# Patient Record
Sex: Female | Born: 1990 | Race: White | Hispanic: No | Marital: Single | Smoking: Current every day smoker
Health system: Southern US, Community
[De-identification: ages and names within clinical notes are randomized; demographics above are authoritative.]

## PROBLEM LIST (undated history)

## (undated) DIAGNOSIS — R112 Nausea with vomiting, unspecified: Secondary | ICD-10-CM

## (undated) DIAGNOSIS — M545 Low back pain, unspecified: Secondary | ICD-10-CM

## (undated) DIAGNOSIS — M5136 Other intervertebral disc degeneration, lumbar region: Secondary | ICD-10-CM

## (undated) DIAGNOSIS — J45909 Unspecified asthma, uncomplicated: Secondary | ICD-10-CM

## (undated) DIAGNOSIS — M519 Unspecified thoracic, thoracolumbar and lumbosacral intervertebral disc disorder: Secondary | ICD-10-CM

## (undated) DIAGNOSIS — M4310 Spondylolisthesis, site unspecified: Secondary | ICD-10-CM

## (undated) DIAGNOSIS — R519 Headache, unspecified: Secondary | ICD-10-CM

## (undated) DIAGNOSIS — M4300 Spondylolysis, site unspecified: Secondary | ICD-10-CM

## (undated) HISTORY — PX: DILATION AND CURETTAGE OF UTERUS: SHX78

## (undated) HISTORY — DX: Unspecified thoracic, thoracolumbar and lumbosacral intervertebral disc disorder: M51.9

## (undated) HISTORY — DX: Other intervertebral disc degeneration, lumbar region: M51.36

## (undated) HISTORY — DX: Spondylolisthesis, site unspecified: M43.10

## (undated) HISTORY — DX: Low back pain, unspecified: M54.50

## (undated) HISTORY — DX: Spondylolysis, site unspecified: M43.00

## (undated) HISTORY — PX: TONSILLECTOMY: SUR1361

---

## 2021-01-18 ENCOUNTER — Other Ambulatory Visit: Payer: Self-pay

## 2021-01-18 ENCOUNTER — Other Ambulatory Visit: Payer: Self-pay | Admitting: Orthopedic Surgery

## 2021-01-18 DIAGNOSIS — R200 Anesthesia of skin: Secondary | ICD-10-CM | POA: Insufficient documentation

## 2021-01-18 DIAGNOSIS — R29898 Other symptoms and signs involving the musculoskeletal system: Secondary | ICD-10-CM

## 2021-01-18 DIAGNOSIS — M79604 Pain in right leg: Secondary | ICD-10-CM | POA: Insufficient documentation

## 2021-01-18 DIAGNOSIS — M545 Low back pain, unspecified: Secondary | ICD-10-CM

## 2021-01-18 DIAGNOSIS — M79605 Pain in left leg: Secondary | ICD-10-CM

## 2021-01-23 ENCOUNTER — Other Ambulatory Visit: Payer: Self-pay

## 2021-01-23 ENCOUNTER — Encounter: Payer: Self-pay | Admitting: Vascular Surgery

## 2021-01-23 ENCOUNTER — Ambulatory Visit (INDEPENDENT_AMBULATORY_CARE_PROVIDER_SITE_OTHER): Payer: No Typology Code available for payment source | Admitting: Vascular Surgery

## 2021-01-23 VITALS — BP 100/69 | HR 64 | Temp 98.2°F | Resp 14 | Ht 62.0 in | Wt 118.0 lb

## 2021-01-23 DIAGNOSIS — M545 Low back pain, unspecified: Secondary | ICD-10-CM | POA: Diagnosis not present

## 2021-01-23 NOTE — Progress Notes (Signed)
Patient name: April Montoya MRN: [id number] DOB: 1990/11/05 Sex: female  REASON FOR CONSULT: Evaluate for L5-S1 ALIF  HPI: April Montoya is a 30 y.o. female, with degenerative disc disease and asthma that presents for evaluation of L5-S1 ALIF.  Patient states she had an injury at work last year when she was working [employer].  She has had chronic back pain since that injury.  Ultimately she has been under the care of Dr. Yevette Edwards and has failed conservative management.  He has recommended an anterior L5-S1 fusion and vascular surgery was asked to evaluate her for abdominal exposure.  She states she has had no previous abdominal surgery.  She has had a D&C that was transvaginal.  She does smoke cigarettes.  Past Medical History:  Diagnosis Date   Annular tear    DDD (degenerative disc disease), lumbar    L5-S1   Low back pain    Increased with sitting   Pars defect    Bilateral   Spondylolisthesis     Past surgical history: Denies  No family history on file.  SOCIAL HISTORY: Social History   Socioeconomic History   Marital status: Single    Spouse name: Not on file   Number of children: Not on file   Years of education: Not on file   Highest education level: Not on file  Occupational History   Not on file  Tobacco Use   Smoking status: Not on file   Smokeless tobacco: Not on file  Substance and Sexual Activity   Alcohol use: Not on file   Drug use: Not on file   Sexual activity: Not on file  Other Topics Concern   Not on file  Social History Narrative   Not on file   Social Determinants of Health   Financial Resource Strain: Not on file  Food Insecurity: Not on file  Transportation Needs: Not on file  Physical Activity: Not on file  Stress: Not on file  Social Connections: Not on file  Intimate Partner Violence: Not on file    Not on File  Current Outpatient Medications  Medication Sig Dispense Refill   diclofenac (VOLTAREN) 75 MG EC  tablet Take 75 mg by mouth 2 (two) times daily.     ondansetron (ZOFRAN-ODT) 4 MG disintegrating tablet Take by mouth.     No current facility-administered medications for this visit.    REVIEW OF SYSTEMS:  [X]  denotes positive finding, [ ]  denotes negative finding Cardiac  Comments:  Chest pain or chest pressure:    Shortness of breath upon exertion:    Short of breath when lying flat:    Irregular heart rhythm:        Vascular    Pain in calf, thigh, or hip brought on by ambulation:    Pain in feet at night that wakes you up from your sleep:     Blood clot in your veins:    Leg swelling:         Pulmonary    Oxygen at home:    Productive cough:     Wheezing:         Neurologic    Sudden weakness in arms or legs:     Sudden numbness in arms or legs:     Sudden onset of difficulty speaking or slurred speech:    Temporary loss of vision in one eye:     Problems with dizziness:         Gastrointestinal  Blood in stool:     Vomited blood:         Genitourinary    Burning when urinating:     Blood in urine:        Psychiatric    Major depression:         Hematologic    Bleeding problems:    Problems with blood clotting too easily:        Skin    Rashes or ulcers:        Constitutional    Fever or chills:      PHYSICAL EXAM: There were no vitals filed for this visit.  GENERAL: The patient is a well-nourished female, in no acute distress. The vital signs are documented above. CARDIAC: There is a regular rate and rhythm.  VASCULAR:  2+ palpable femoral pulses bilaterally 2+ palpable PT pulses bilaterally PULMONARY: No respiratory distress. ABDOMEN: Soft and non-tender.  No previous incisions. MUSCULOSKELETAL: There are no major deformities or cyanosis. NEUROLOGIC: No focal weakness or paresthesias are detected. SKIN: There are no ulcers or rashes noted. PSYCHIATRIC: The patient has a normal affect.  DATA:   MRI reviewed 12/20/2020 and L5-S1 disc space  as well as vessels visualized  Assessment/Plan:  30 year old female presents with chronic lower back pain for preop evaluation of planned L5-S1 ALIF with Dr. Yevette Edwards.  Vascular surgery has been asked to assist with anterior abdominal exposure.  She has no previous abdominal surgical history and I reviewed her MRI.  I think she should be a good candidate for anterior approach.  I discussed transverse incision over the L5-S1 disc space and mobilizing the rectus and then entering the retroperitoneum to mobilize the peritoneum and ureter across midline.  We discussed mobilizing iliac artery and vein.  Discussed risk of injury to the above structures.  All questions answered.   Cephus Shelling, MD [facility] Office: [phone]

## 2021-02-02 ENCOUNTER — Other Ambulatory Visit: Payer: Self-pay

## 2021-02-02 NOTE — Pre-Procedure Instructions (Signed)
Surgical Instructions    Your procedure is scheduled on Thursday 02/15/21.   Report to [facility] Main Entrance "A" at 05:30 A.M., then check in with the Admitting office.  Call this number if you have problems the morning of surgery:  [phone]   If you have any questions prior to your surgery date call [phone]: Open Monday-Friday 8am-4pm    Remember:  Do not eat after midnight the night before your surgery  You may drink clear liquids until 04:30 A.M. the morning of your surgery.   Clear liquids allowed are: Water, Non-Citrus Juices (without pulp), Carbonated Beverages, Clear Tea, Black Coffee ONLY (NO MILK, CREAM OR POWDERED CREAMER of any kind), and Gatorade  Patient Instructions  The night before surgery:  No food after midnight. ONLY clear liquids after midnight  The day of surgery (if you do NOT have diabetes):  Drink ONE (1) Pre-Surgery Clear Ensure by 04:30 A.M. the morning of surgery. Drink in one sitting. Do not sip.  This drink was given to you during your hospital  pre-op appointment visit.  Nothing else to drink after completing the  Pre-Surgery Clear Ensure.         If you have questions, please contact your surgeon's office.     Take these medicines the morning of surgery with A SIP OF WATER   albuterol (VENTOLIN HFA)- If needed      As of today, STOP taking any Aspirin (unless otherwise instructed by your surgeon) Aleve, Naproxen, Ibuprofen, Motrin, Advil, Goody's, BC's, all herbal medications, fish oil, Voltaren and all vitamins.          Do not wear jewelry or makeup Do not wear lotions, powders, perfumes/colognes, or deodorant. Do not shave 48 hours prior to surgery.  Men may shave face and neck. Do not bring valuables to the hospital. DO Not wear nail polish, gel polish, artificial nails, or any other type of covering on natural nails including finger and toenails. If patients have artificial nails, gel coating, etc. that need to be removed  by a nail salon please have this removed prior to surgery or surgery may need to be canceled/delayed if the surgeon/ anesthesia feels like the patient is unable to be adequately monitored.             April Montoya is not responsible for any belongings or valuables.  Do NOT Smoke (Tobacco/Vaping)  24 hours prior to your procedure If you use a CPAP at night, you may bring all equipment for your overnight stay.   Contacts, glasses, dentures or bridgework may not be worn into surgery, please bring cases for these belongings   For patients admitted to the hospital, discharge time will be determined by your treatment team.   Patients discharged the day of surgery will not be allowed to drive home, and someone needs to stay with them for 24 hours.  ONLY 1 SUPPORT PERSON MAY BE PRESENT WHILE YOU ARE IN SURGERY. IF YOU ARE TO BE ADMITTED ONCE YOU ARE IN YOUR ROOM YOU WILL BE ALLOWED TWO (2) VISITORS.  Minor children may have two parents present. Special consideration for safety and communication needs will be reviewed on a case by case basis.  Special instructions:    Oral Hygiene is also important to reduce your risk of infection.  Remember - BRUSH YOUR TEETH THE MORNING OF SURGERY WITH YOUR REGULAR TOOTHPASTE   [place]- Preparing For Surgery  Before surgery, you can play an important role. Because skin is  not sterile, your skin needs to be as free of germs as possible. You can reduce the number of germs on your skin by washing with CHG (chlorahexidine gluconate) Soap before surgery.  CHG is an antiseptic cleaner which kills germs and bonds with the skin to continue killing germs even after washing.     Please do not use if you have an allergy to CHG or antibacterial soaps. If your skin becomes reddened/irritated stop using the CHG.  Do not shave (including legs and underarms) for at least 48 hours prior to first CHG shower. It is OK to shave your face.  Please follow these instructions  carefully.     Shower the NIGHT BEFORE SURGERY and the MORNING OF SURGERY with CHG Soap.   If you chose to wash your hair, wash your hair first as usual with your normal shampoo. After you shampoo, rinse your hair and body thoroughly to remove the shampoo.  Then [employer] and genitals (private parts) with your normal soap and rinse thoroughly to remove soap.  After that Use CHG Soap as you would any other liquid soap. You can apply CHG directly to the skin and wash gently with a scrungie or a clean washcloth.   Apply the CHG Soap to your body ONLY FROM THE NECK DOWN.  Do not use on open wounds or open sores. Avoid contact with your eyes, ears, mouth and genitals (private parts). Wash Face and genitals (private parts)  with your normal soap.   Wash thoroughly, paying special attention to the area where your surgery will be performed.  Thoroughly rinse your body with warm water from the neck down.  DO NOT shower/wash with your normal soap after using and rinsing off the CHG Soap.  Pat yourself dry with a CLEAN TOWEL.  Wear CLEAN PAJAMAS to bed the night before surgery  Place CLEAN SHEETS on your bed the night before your surgery  DO NOT SLEEP WITH PETS.   Day of Surgery:  Take a shower with CHG soap. Wear Clean/Comfortable clothing the morning of surgery Do not apply any deodorants/lotions.   Remember to brush your teeth WITH YOUR REGULAR TOOTHPASTE.   Please read over the following fact sheets that you were given.

## 2021-02-06 ENCOUNTER — Inpatient Hospital Stay (HOSPITAL_COMMUNITY)
Admission: RE | Admit: 2021-02-06 | Discharge: 2021-02-06 | Disposition: A | Discharge status: Home / Self Care | Payer: BC Managed Care – PPO | Source: Ambulatory Visit

## 2021-02-08 ENCOUNTER — Encounter (HOSPITAL_COMMUNITY): Payer: Self-pay

## 2021-02-08 ENCOUNTER — Encounter (HOSPITAL_COMMUNITY)
Admission: RE | Admit: 2021-02-08 | Discharge: 2021-02-08 | Disposition: A | Discharge status: Home / Self Care | Payer: BC Managed Care – PPO | Source: Ambulatory Visit | Attending: Orthopedic Surgery | Admitting: Orthopedic Surgery

## 2021-02-08 ENCOUNTER — Other Ambulatory Visit: Payer: Self-pay

## 2021-02-08 DIAGNOSIS — Z01812 Encounter for preprocedural laboratory examination: Secondary | ICD-10-CM | POA: Insufficient documentation

## 2021-02-08 HISTORY — DX: Headache, unspecified: R51.9

## 2021-02-08 HISTORY — DX: Unspecified asthma, uncomplicated: J45.909

## 2021-02-08 LAB — URINALYSIS, ROUTINE W REFLEX MICROSCOPIC
Bacteria, UA: NONE SEEN
Bilirubin Urine: NEGATIVE
Glucose, UA: NEGATIVE mg/dL
Ketones, ur: NEGATIVE mg/dL
Leukocytes,Ua: NEGATIVE
Nitrite: NEGATIVE
Protein, ur: NEGATIVE mg/dL
Specific Gravity, Urine: 1.012 (ref 1.005–1.030)
pH: 6 (ref 5.0–8.0)

## 2021-02-08 LAB — CBC WITH DIFFERENTIAL/PLATELET
Abs Immature Granulocytes: 0.01 10*3/uL (ref 0.00–0.07)
Basophils Absolute: 0.1 10*3/uL (ref 0.0–0.1)
Basophils Relative: 1 %
Eosinophils Absolute: 0.1 10*3/uL (ref 0.0–0.5)
Eosinophils Relative: 1 %
HCT: 43 % (ref 36.0–46.0)
Hemoglobin: 13.9 g/dL (ref 12.0–15.0)
Immature Granulocytes: 0 %
Lymphocytes Relative: 30 %
Lymphs Abs: 1.9 10*3/uL (ref 0.7–4.0)
MCH: 29.2 pg (ref 26.0–34.0)
MCHC: 32.3 g/dL (ref 30.0–36.0)
MCV: 90.3 fL (ref 80.0–100.0)
Monocytes Absolute: 0.4 10*3/uL (ref 0.1–1.0)
Monocytes Relative: 6 %
Neutro Abs: 4.1 10*3/uL (ref 1.7–7.7)
Neutrophils Relative %: 62 %
Platelets: 221 10*3/uL (ref 150–400)
RBC: 4.76 MIL/uL (ref 3.87–5.11)
RDW: 11.9 % (ref 11.5–15.5)
WBC: 6.6 10*3/uL (ref 4.0–10.5)
nRBC: 0 % (ref 0.0–0.2)

## 2021-02-08 LAB — PROTIME-INR
INR: 1.1 (ref 0.8–1.2)
Prothrombin Time: 14 seconds (ref 11.4–15.2)

## 2021-02-08 LAB — COMPREHENSIVE METABOLIC PANEL
ALT: 16 U/L (ref 0–44)
AST: 20 U/L (ref 15–41)
Albumin: 4.4 g/dL (ref 3.5–5.0)
Alkaline Phosphatase: 54 U/L (ref 38–126)
Anion gap: 7 (ref 5–15)
BUN: 16 mg/dL (ref 6–20)
CO2: 27 mmol/L (ref 22–32)
Calcium: 9.5 mg/dL (ref 8.9–10.3)
Chloride: 104 mmol/L (ref 98–111)
Creatinine, Ser: 0.63 mg/dL (ref 0.44–1.00)
GFR, Estimated: >60 mL/min (ref >60)
Glucose, Bld: 76 mg/dL (ref 70–99)
Potassium: 4 mmol/L (ref 3.5–5.1)
Sodium: 138 mmol/L (ref 135–145)
Total Bilirubin: 0.7 mg/dL (ref 0.3–1.2)
Total Protein: 7.1 g/dL (ref 6.5–8.1)

## 2021-02-08 LAB — SURGICAL PCR SCREEN
MRSA, PCR: NEGATIVE
Staphylococcus aureus: NEGATIVE

## 2021-02-08 LAB — APTT: aPTT: 26 seconds (ref 24–36)

## 2021-02-08 NOTE — Progress Notes (Addendum)
PCP - no pcp Cardiologist - denies  PPM/ICD - denies   Chest x-ray - n/a EKG - n/a Stress Test -denies  ECHO - denies Cardiac Cath - denies  Sleep Study - denies   No diabetes  As of today, STOP taking any Aspirin (unless otherwise instructed by your surgeon) Aleve, Naproxen, Ibuprofen, Motrin, Advil, Goody's, BC's, all herbal medications, fish oil, Voltaren and all vitamins.  ERAS Protcol -no   COVID TEST- 02/13/21 at 1pm   Anesthesia review: yes, positive pregnancy test morning of PAT appt. [place] notified and instructed patient to go get an ultrasound today. Will await further instruction. Revonda Standard, PA-C informed of information.   Patient denies shortness of breath, fever, cough and chest pain at PAT appointment   All instructions explained to the patient, with a verbal understanding of the material. Patient agrees to go over the instructions while at home for a better understanding. Patient also instructed to self quarantine after being tested for COVID-19. The opportunity to ask questions was provided.

## 2021-02-09 NOTE — Progress Notes (Signed)
Called patient to follow up about ultrasound appointment. Voicemail left and phone number updated in chart. Patient stated she was going for an ultrasound at the ER after her PAT appt yesterday.

## 2021-02-09 NOTE — Progress Notes (Addendum)
Anesthesia Chart Review:  Case: [id number] Date/Time: 02/15/21 0715   Procedures:      ANTERIOR LUMBAR INTERBODY FUSION LUMBAR 5 - SACRUM 1 WITH INSTRUMENTATION AND ALLOGRAFT     POSTERIOR SPINAL FUSION LUMBAR 5 - SACRUM 1 WITH INSTRUMENTATION AND ALLOGRAFT     ABDOMINAL EXPOSURE   Anesthesia type: General   Pre-op diagnosis: DEGENERATIVE DISC DISEASE   [facility] ROOM 05 / [facility] OR   Surgeons: Estill Bamberg, MD; Cephus Shelling, MD       DISCUSSION: Patient is a 30 year old female scheduled for the above procedure. She had a work-related injury.   History includes smoking, asthma, degenerative disc disease.  Patient reported + home pregnancy test on 02/08/21. Dr. Yevette Edwards was notified. Reportedly, she does not have a PCP or current GYN but attempted to contact previous GYN and could not be worked in to clinic, so GYN office advised her to go to the the ED for an [place]. She reportedly was seen at the [facility] ED in [place] on 02/09/21 and was told she was not pregnant but was referred to a "specialist" for right ovarian cyst. She wants to proceed with surgery.   Plans to proceed and any additional lab orders will need to be determined after further review of 9/922 ED records. They are not currently viewable in Care Everywhere. Chart will be left for follow-up.  Currently she is scheduled for preoperative COVID-19 testing on 02/13/2021.  ADDENDUM 02/12/21 9:45 AM:  02/09/21 ED visit is now viewable in Care Everywhere. She presented with vaginal bleeding and reports of + home pregnancy test a few days prior. History of previous miscarriage of twins. Per documentation, "Patient underwent laboratory studies showing no significant abnormalities and urine hCG was negative. 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 Urine B-HCG was negative. Urine culture + > 100,000 Lactococcus species. WBC 5.7, H/H 13.3/40.3, PLT 200, Na 140, K 4.3, glucose 86, BUN 14, Cr 0.59. Urine culture results called to Lupita Leash at Dr. Marshell Levan office.   ADDENDUM 02/13/21 11:07 AM: Lupita Leash says PA to call in Cipro if ED did not treat Lactococcus UTI. Also reviewed 02/09/21 ED note with anesthesiologist Marcene Duos, MD. Plan for repeat H/H (since vaginal bleeding at ED visit) and serum qualitative B-hCG on arrival. Per PAT documentation, she also needs her T&S done on the day of surgery given reported + home pregnancy test on 02/08/21.    VS: BP 101/65   Pulse (!) 56   Temp 36.9 C (Oral)   Resp 17   Ht 5\' 2"  (1.575 m)   Wt 53 kg   SpO2 96%   BMI 21.38 kg/m    PROVIDERS: Pcp, No   LABS: Preoperative PAT labs noted.  (all labs ordered are listed, but only abnormal results are displayed)  Labs Reviewed  URINALYSIS, ROUTINE W REFLEX MICROSCOPIC - Abnormal; Notable for the following components:      Result Value   Hgb urine dipstick SMALL (*)    All other components within normal limits  SURGICAL PCR SCREEN  CBC WITH DIFFERENTIAL/PLATELET  COMPREHENSIVE METABOLIC PANEL  PROTIME-INR  APTT    IMAGES: pelvis, transvaginal 02/09/21 ([facility]): INTERPRETATION:  - The uterus is normal in size and echotexture , measuring 8.3  X 3.7 X 4.5 cm.  The endometrial stripe is normal in thickness , measuring 2 mm.  - Right ovary is normal in size and appearance, measuring 3.1 X 1.9 X 2.0 cm. Multiple small follicular type cysts are noted.  Color flow and spectral Doppler: Normal arterial inflow and venous outflow. No focal tenderness.  - Left ovary is normal in size and appearance, measuring 2.7 X 1.7 X 3.1 cm. Multiple small follicular type cysts are noted. Also evident is a larger (10 x 15 x 18 mm) cyst with several  smaller cysts protruding into the lumen, appearance consistent with incidental cumulus oophorus.  Color flow and spectral Doppler: Normal arterial inflow and venous outflow.  - Note is made of prominent vessels in the left adnexal region.  - There is trace free fluid. 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 Otherwise unremarkable.    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 The L5-S1 disc demonstrates degeneration as described above. There is no central canal stenosis. There is mild bilateral foraminal stenosis with minimal extraforaminal abutment of both traversing L5 nerve roots.    EKG: N/A   CV: N/A  Past Medical History:  Diagnosis Date   Annular tear    Asthma    DDD (degenerative disc disease), lumbar    L5-S1   Headache    Low back pain    Increased with sitting   Pars defect    Bilateral   Spondylolisthesis     Past Surgical History:  Procedure Laterality Date   DILATION AND CURETTAGE OF UTERUS     TONSILLECTOMY      MEDICATIONS:  albuterol (VENTOLIN HFA) 108 (90 Base) MCG/ACT inhaler   Cyanocobalamin (B-12 PO)   diclofenac (VOLTAREN) 75 MG EC tablet   etonogestrel (NEXPLANON) 68 MG IMPL implant   Multiple Vitamin (MULTIVITAMIN WITH MINERALS) TABS tablet   No current facility-administered medications for this encounter.    Shonna Chock, PA-C Surgical Short Stay/Anesthesiology [facility] Phone [phone] [facility] Phone [phone] 02/09/2021 2:37 PM

## 2021-02-09 NOTE — Progress Notes (Signed)
Error/ Duplicate

## 2021-02-13 ENCOUNTER — Other Ambulatory Visit (HOSPITAL_COMMUNITY)
Admission: RE | Admit: 2021-02-13 | Discharge: 2021-02-13 | Disposition: A | Discharge status: Home / Self Care | Payer: No Typology Code available for payment source | Source: Ambulatory Visit | Attending: Orthopedic Surgery | Admitting: Orthopedic Surgery

## 2021-02-13 ENCOUNTER — Other Ambulatory Visit: Payer: Self-pay

## 2021-02-13 DIAGNOSIS — Z20822 Contact with and (suspected) exposure to covid-19: Secondary | ICD-10-CM | POA: Insufficient documentation

## 2021-02-13 DIAGNOSIS — Z01812 Encounter for preprocedural laboratory examination: Secondary | ICD-10-CM | POA: Insufficient documentation

## 2021-02-13 LAB — SARS CORONAVIRUS 2 (TAT 6-24 HRS): SARS Coronavirus 2: NEGATIVE

## 2021-02-13 NOTE — Anesthesia Preprocedure Evaluation (Addendum)
Anesthesia Evaluation  Patient identified by MRN, date of birth, ID band Patient awake    Reviewed: Allergy & Precautions, NPO status , Patient's Chart, lab work & pertinent test results  History of Anesthesia Complications (+) PONV, Emergence Delirium and history of anesthetic complications  Airway Mallampati: I  TM Distance: >3 FB Neck ROM: Full    Dental no notable dental hx.    Pulmonary asthma , Current Smoker,    Pulmonary exam normal breath sounds clear to auscultation       Cardiovascular negative cardio ROS Normal cardiovascular exam Rhythm:Regular Rate:Normal     Neuro/Psych  Headaches, Anxiety    GI/Hepatic negative GI ROS, Neg liver ROS,   Endo/Other  negative endocrine ROS  Renal/GU negative Renal ROS  negative genitourinary   Musculoskeletal  (+) Arthritis , Osteoarthritis,    Abdominal Normal abdominal exam  (+)   Peds negative pediatric ROS (+)  Hematology negative hematology ROS (+)   Anesthesia Other Findings   Reproductive/Obstetrics negative OB ROS                            Anesthesia Physical Anesthesia Plan  ASA: 2  Anesthesia Plan: General   Post-op Pain Management:    Induction: Intravenous  PONV Risk Score and Plan: 3 and Treatment may vary due to age or medical condition, Ondansetron, Dexamethasone, Midazolam, Scopolamine patch - Pre-op and Propofol infusion  Airway Management Planned: Oral ETT  Additional Equipment: None  Intra-op Plan:   Post-operative Plan:   Informed Consent: I have reviewed the patients History and Physical, chart, labs and discussed the procedure including the risks, benefits and alternatives for the proposed anesthesia with the patient or authorized representative who has indicated his/her understanding and acceptance.     Dental advisory given  Plan Discussed with: Anesthesiologist, Surgeon and CRNA  Anesthesia Plan  Comments: (Will await results of serum pregnancy test. GETA. 2 PIV. ASA SLM.   See PAT note written 02/13/2021 by Shonna Chock, PA-C.  02/09/21 ED visit with vaginal bleeding and reports of + home pregnancy test a few days prior. History of previous miscarriage of twins. Per documentation [facility] Everywhere), "Patient underwent laboratory studies showing no significant abnormalities and urine hCG was negative. 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 Urine B-HCG was negative. Urine culture + > 100,000 Lactococcus species. WBC 5.7, H/H 13.3/40.3, PLT 200, Na 140, K 4.3, glucose 86, BUN 14, Cr 0.59. Urine culture results called to Lupita Leash at Dr. Marshell Levan office. Discussed with Dr. Marcene Duos. Plan for H/H and serum qualitative B-hCG on arrival for surgery.   )       Anesthesia Quick Evaluation

## 2021-02-14 ENCOUNTER — Encounter (HOSPITAL_COMMUNITY): Payer: Self-pay | Admitting: Orthopedic Surgery

## 2021-02-15 ENCOUNTER — Inpatient Hospital Stay: Payer: Self-pay

## 2021-02-15 ENCOUNTER — Inpatient Hospital Stay (HOSPITAL_COMMUNITY)
Admission: RE | Admit: 2021-02-15 | Discharge: 2021-02-16 | DRG: 455 | Disposition: A | Discharge status: Home / Self Care | Payer: No Typology Code available for payment source | Attending: Orthopedic Surgery | Admitting: Orthopedic Surgery

## 2021-02-15 ENCOUNTER — Inpatient Hospital Stay (HOSPITAL_COMMUNITY): Payer: BC Managed Care – PPO

## 2021-02-15 ENCOUNTER — Inpatient Hospital Stay (HOSPITAL_COMMUNITY): Payer: No Typology Code available for payment source | Admitting: Vascular Surgery

## 2021-02-15 ENCOUNTER — Other Ambulatory Visit: Payer: Self-pay

## 2021-02-15 ENCOUNTER — Inpatient Hospital Stay (HOSPITAL_COMMUNITY)
Admission: RE | Disposition: A | Discharge status: Home / Self Care | Payer: Self-pay | Source: Home / Self Care | Attending: Orthopedic Surgery

## 2021-02-15 ENCOUNTER — Encounter (HOSPITAL_COMMUNITY): Payer: Self-pay | Admitting: Orthopedic Surgery

## 2021-02-15 DIAGNOSIS — M5410 Radiculopathy, site unspecified: Secondary | ICD-10-CM | POA: Diagnosis present

## 2021-02-15 DIAGNOSIS — M5117 Intervertebral disc disorders with radiculopathy, lumbosacral region: Secondary | ICD-10-CM | POA: Diagnosis present

## 2021-02-15 DIAGNOSIS — M5136 Other intervertebral disc degeneration, lumbar region: Secondary | ICD-10-CM | POA: Diagnosis present

## 2021-02-15 DIAGNOSIS — M4317 Spondylolisthesis, lumbosacral region: Secondary | ICD-10-CM | POA: Diagnosis present

## 2021-02-15 DIAGNOSIS — M4807 Spinal stenosis, lumbosacral region: Secondary | ICD-10-CM | POA: Diagnosis present

## 2021-02-15 DIAGNOSIS — F419 Anxiety disorder, unspecified: Secondary | ICD-10-CM | POA: Diagnosis present

## 2021-02-15 DIAGNOSIS — J45909 Unspecified asthma, uncomplicated: Secondary | ICD-10-CM | POA: Diagnosis present

## 2021-02-15 DIAGNOSIS — M5416 Radiculopathy, lumbar region: Secondary | ICD-10-CM | POA: Diagnosis not present

## 2021-02-15 DIAGNOSIS — Y990 Civilian activity done for income or pay: Secondary | ICD-10-CM

## 2021-02-15 DIAGNOSIS — Z882 Allergy status to sulfonamides status: Secondary | ICD-10-CM

## 2021-02-15 DIAGNOSIS — Z79899 Other long term (current) drug therapy: Secondary | ICD-10-CM

## 2021-02-15 DIAGNOSIS — F17210 Nicotine dependence, cigarettes, uncomplicated: Secondary | ICD-10-CM | POA: Diagnosis present

## 2021-02-15 DIAGNOSIS — Z20822 Contact with and (suspected) exposure to covid-19: Secondary | ICD-10-CM | POA: Diagnosis present

## 2021-02-15 DIAGNOSIS — X58XXXA Exposure to other specified factors, initial encounter: Secondary | ICD-10-CM | POA: Diagnosis present

## 2021-02-15 DIAGNOSIS — Z419 Encounter for procedure for purposes other than remedying health state, unspecified: Secondary | ICD-10-CM

## 2021-02-15 HISTORY — PX: ANTERIOR LUMBAR FUSION: SHX1170

## 2021-02-15 HISTORY — DX: Nausea with vomiting, unspecified: R11.2

## 2021-02-15 HISTORY — PX: ABDOMINAL EXPOSURE: SHX5708

## 2021-02-15 LAB — TYPE AND SCREEN
ABO/RH(D): A POS
Antibody Screen: NEGATIVE

## 2021-02-15 LAB — HEMOGLOBIN AND HEMATOCRIT, BLOOD
HCT: 36.8 % (ref 36.0–46.0)
Hemoglobin: 11.9 g/dL — ABNORMAL LOW (ref 12.0–15.0)

## 2021-02-15 LAB — HCG, SERUM, QUALITATIVE: Preg, Serum: NEGATIVE

## 2021-02-15 LAB — ABO/RH: ABO/RH(D): A POS

## 2021-02-15 SURGERY — ANTERIOR LUMBAR FUSION 1 LEVEL
Anesthesia: General

## 2021-02-15 MED ORDER — ALBUTEROL SULFATE (2.5 MG/3ML) 0.083% IN NEBU: 2.5000 mg | INHALATION_SOLUTION | Freq: Four times a day (QID) | RESPIRATORY_TRACT | Status: DC | PRN

## 2021-02-15 MED ORDER — SODIUM CHLORIDE 0.9% FLUSH: 3.0000 mL | INTRAVENOUS | Status: DC | PRN

## 2021-02-15 MED ORDER — ORAL CARE MOUTH RINSE: 15.0000 mL | Freq: Once | OROMUCOSAL | Status: AC

## 2021-02-15 MED ORDER — PROPOFOL 10 MG/ML IV BOLUS
INTRAVENOUS | Status: AC
  Filled 2021-02-15: qty 20

## 2021-02-15 MED ORDER — PROPOFOL 10 MG/ML IV BOLUS
INTRAVENOUS | Status: DC | PRN
  Administered 2021-02-15: 120 mg via INTRAVENOUS

## 2021-02-15 MED ORDER — SCOPOLAMINE 1 MG/3DAYS TD PT72
1.0000 | MEDICATED_PATCH | TRANSDERMAL | Status: DC
  Administered 2021-02-15: 1.5 mg via TRANSDERMAL
  Filled 2021-02-15: qty 1

## 2021-02-15 MED ORDER — SODIUM CHLORIDE 0.9% FLUSH: 3.0000 mL | Freq: Two times a day (BID) | INTRAVENOUS | Status: DC

## 2021-02-15 MED ORDER — FLEET ENEMA 7-19 GM/118ML RE ENEM: 1.0000 | ENEMA | Freq: Once | RECTAL | Status: DC | PRN

## 2021-02-15 MED ORDER — PROMETHAZINE HCL 25 MG/ML IJ SOLN
INTRAMUSCULAR | Status: AC
  Filled 2021-02-15: qty 1

## 2021-02-15 MED ORDER — OXYCODONE HCL 5 MG PO TABS: 5.0000 mg | ORAL_TABLET | Freq: Once | ORAL | Status: DC | PRN

## 2021-02-15 MED ORDER — BUPIVACAINE LIPOSOME 1.3 % IJ SUSP
INTRAMUSCULAR | Status: DC | PRN
  Administered 2021-02-15: 20 mL

## 2021-02-15 MED ORDER — MORPHINE SULFATE (PF) 2 MG/ML IV SOLN
1.0000 mg | INTRAVENOUS | Status: DC | PRN
  Administered 2021-02-15: 2 mg via INTRAVENOUS
  Filled 2021-02-15: qty 1

## 2021-02-15 MED ORDER — DOCUSATE SODIUM 100 MG PO CAPS
100.0000 mg | ORAL_CAPSULE | Freq: Two times a day (BID) | ORAL | Status: DC
  Administered 2021-02-15 – 2021-02-16 (×2): 100 mg via ORAL
  Filled 2021-02-15 (×2): qty 1

## 2021-02-15 MED ORDER — DROPERIDOL 2.5 MG/ML IJ SOLN: 0.6250 mg | Freq: Once | INTRAMUSCULAR | Status: DC | PRN

## 2021-02-15 MED ORDER — METHOCARBAMOL 500 MG PO TABS
500.0000 mg | ORAL_TABLET | Freq: Four times a day (QID) | ORAL | Status: DC | PRN
  Administered 2021-02-15 – 2021-02-16 (×4): 500 mg via ORAL
  Filled 2021-02-15 (×4): qty 1

## 2021-02-15 MED ORDER — CHLORHEXIDINE GLUCONATE CLOTH 2 % EX PADS: 6.0000 | MEDICATED_PAD | Freq: Once | CUTANEOUS | Status: DC

## 2021-02-15 MED ORDER — FENTANYL CITRATE (PF) 100 MCG/2ML IJ SOLN
25.0000 ug | INTRAMUSCULAR | Status: AC | PRN
  Administered 2021-02-15 (×6): 25 ug via INTRAVENOUS

## 2021-02-15 MED ORDER — OXYCODONE-ACETAMINOPHEN 5-325 MG PO TABS
1.0000 | ORAL_TABLET | ORAL | Status: DC | PRN
  Administered 2021-02-15 – 2021-02-16 (×5): 2 via ORAL
  Filled 2021-02-15 (×5): qty 2

## 2021-02-15 MED ORDER — HYDROMORPHONE HCL 1 MG/ML IJ SOLN
INTRAMUSCULAR | Status: AC
  Filled 2021-02-15: qty 0.5

## 2021-02-15 MED ORDER — POTASSIUM CHLORIDE IN NACL 20-0.9 MEQ/L-% IV SOLN: INTRAVENOUS | Status: DC

## 2021-02-15 MED ORDER — SODIUM CHLORIDE 0.9 % IV SOLN
250.0000 mL | INTRAVENOUS | Status: DC
  Administered 2021-02-15: 250 mL via INTRAVENOUS

## 2021-02-15 MED ORDER — BISACODYL 5 MG PO TBEC: 5.0000 mg | DELAYED_RELEASE_TABLET | Freq: Every day | ORAL | Status: DC | PRN

## 2021-02-15 MED ORDER — BUPIVACAINE HCL (PF) 0.25 % IJ SOLN
INTRAMUSCULAR | Status: AC
  Filled 2021-02-15: qty 30

## 2021-02-15 MED ORDER — OXYCODONE HCL 5 MG/5ML PO SOLN: 5.0000 mg | Freq: Once | ORAL | Status: DC | PRN

## 2021-02-15 MED ORDER — MENTHOL 3 MG MT LOZG: 1.0000 | LOZENGE | OROMUCOSAL | Status: DC | PRN

## 2021-02-15 MED ORDER — HYDROCODONE-ACETAMINOPHEN 5-325 MG PO TABS: 1.0000 | ORAL_TABLET | ORAL | Status: DC | PRN

## 2021-02-15 MED ORDER — ZOLPIDEM TARTRATE 5 MG PO TABS: 5.0000 mg | ORAL_TABLET | Freq: Every evening | ORAL | Status: DC | PRN

## 2021-02-15 MED ORDER — LACTATED RINGERS IV SOLN: INTRAVENOUS | Status: DC

## 2021-02-15 MED ORDER — BUPIVACAINE LIPOSOME 1.3 % IJ SUSP
INTRAMUSCULAR | Status: AC
  Filled 2021-02-15: qty 20

## 2021-02-15 MED ORDER — FENTANYL CITRATE (PF) 100 MCG/2ML IJ SOLN
INTRAMUSCULAR | Status: AC
  Filled 2021-02-15: qty 2

## 2021-02-15 MED ORDER — FENTANYL CITRATE (PF) 250 MCG/5ML IJ SOLN
INTRAMUSCULAR | Status: AC
  Filled 2021-02-15: qty 5

## 2021-02-15 MED ORDER — ONDANSETRON HCL 4 MG/2ML IJ SOLN: 4.0000 mg | Freq: Four times a day (QID) | INTRAMUSCULAR | Status: DC | PRN

## 2021-02-15 MED ORDER — CHLORHEXIDINE GLUCONATE 0.12 % MT SOLN
15.0000 mL | Freq: Once | OROMUCOSAL | Status: AC
  Administered 2021-02-15: 15 mL via OROMUCOSAL
  Filled 2021-02-15: qty 15

## 2021-02-15 MED ORDER — POVIDONE-IODINE 7.5 % EX SOLN
Freq: Once | CUTANEOUS | Status: DC
  Filled 2021-02-15: qty 118

## 2021-02-15 MED ORDER — ACETAMINOPHEN 650 MG RE SUPP: 650.0000 mg | RECTAL | Status: DC | PRN

## 2021-02-15 MED ORDER — THROMBIN (RECOMBINANT) 20000 UNITS EX SOLR
CUTANEOUS | Status: AC
  Filled 2021-02-15: qty 20000

## 2021-02-15 MED ORDER — PROMETHAZINE HCL 25 MG/ML IJ SOLN
6.2500 mg | INTRAMUSCULAR | Status: DC | PRN
  Administered 2021-02-15: 12.5 mg via INTRAVENOUS

## 2021-02-15 MED ORDER — MIDAZOLAM HCL 2 MG/2ML IJ SOLN
INTRAMUSCULAR | Status: AC
  Filled 2021-02-15: qty 2

## 2021-02-15 MED ORDER — HYDROMORPHONE HCL 1 MG/ML IJ SOLN
INTRAMUSCULAR | Status: DC | PRN
  Administered 2021-02-15: .5 mg via INTRAVENOUS

## 2021-02-15 MED ORDER — ROCURONIUM BROMIDE 10 MG/ML (PF) SYRINGE
PREFILLED_SYRINGE | INTRAVENOUS | Status: DC | PRN
  Administered 2021-02-15: 70 mg via INTRAVENOUS
  Administered 2021-02-15: 10 mg via INTRAVENOUS
  Administered 2021-02-15: 20 mg via INTRAVENOUS
  Administered 2021-02-15: 10 mg via INTRAVENOUS

## 2021-02-15 MED ORDER — METHOCARBAMOL 1000 MG/10ML IJ SOLN
500.0000 mg | Freq: Four times a day (QID) | INTRAVENOUS | Status: DC | PRN
  Filled 2021-02-15 (×2): qty 5

## 2021-02-15 MED ORDER — BUPIVACAINE HCL (PF) 0.25 % IJ SOLN
INTRAMUSCULAR | Status: DC | PRN
Start: 2021-02-15 — End: 2021-02-15
  Administered 2021-02-15: 20 mL

## 2021-02-15 MED ORDER — ONDANSETRON HCL 4 MG PO TABS: 4.0000 mg | ORAL_TABLET | Freq: Four times a day (QID) | ORAL | Status: DC | PRN

## 2021-02-15 MED ORDER — SENNOSIDES-DOCUSATE SODIUM 8.6-50 MG PO TABS
1.0000 | ORAL_TABLET | Freq: Every evening | ORAL | Status: DC | PRN
  Administered 2021-02-15: 1 via ORAL
  Filled 2021-02-15: qty 1

## 2021-02-15 MED ORDER — 0.9 % SODIUM CHLORIDE (POUR BTL) OPTIME
TOPICAL | Status: DC | PRN
  Administered 2021-02-15: 1000 mL

## 2021-02-15 MED ORDER — PHENOL 1.4 % MT LIQD: 1.0000 | OROMUCOSAL | Status: DC | PRN

## 2021-02-15 MED ORDER — FENTANYL CITRATE (PF) 250 MCG/5ML IJ SOLN
INTRAMUSCULAR | Status: DC | PRN
  Administered 2021-02-15 (×2): 50 ug via INTRAVENOUS
  Administered 2021-02-15: 100 ug via INTRAVENOUS
  Administered 2021-02-15: 50 ug via INTRAVENOUS

## 2021-02-15 MED ORDER — CEFAZOLIN SODIUM-DEXTROSE 2-4 GM/100ML-% IV SOLN
2.0000 g | Freq: Three times a day (TID) | INTRAVENOUS | Status: AC
  Administered 2021-02-15 – 2021-02-16 (×2): 2 g via INTRAVENOUS
  Filled 2021-02-15 (×2): qty 100

## 2021-02-15 MED ORDER — CEFAZOLIN SODIUM-DEXTROSE 2-4 GM/100ML-% IV SOLN
2.0000 g | INTRAVENOUS | Status: AC
  Administered 2021-02-15 (×2): 2 g via INTRAVENOUS
  Filled 2021-02-15: qty 100

## 2021-02-15 MED ORDER — ACETAMINOPHEN 500 MG PO TABS
1000.0000 mg | ORAL_TABLET | Freq: Once | ORAL | Status: AC
  Administered 2021-02-15: 1000 mg via ORAL
  Filled 2021-02-15: qty 2

## 2021-02-15 MED ORDER — SUGAMMADEX SODIUM 200 MG/2ML IV SOLN
INTRAVENOUS | Status: DC | PRN
  Administered 2021-02-15: 200 mg via INTRAVENOUS

## 2021-02-15 MED ORDER — HYDROXYZINE HCL 50 MG/ML IM SOLN: 50.0000 mg | Freq: Four times a day (QID) | INTRAMUSCULAR | Status: DC | PRN

## 2021-02-15 MED ORDER — ALUM & MAG HYDROXIDE-SIMETH 200-200-20 MG/5ML PO SUSP: 30.0000 mL | Freq: Four times a day (QID) | ORAL | Status: DC | PRN

## 2021-02-15 MED ORDER — DEXAMETHASONE SODIUM PHOSPHATE 10 MG/ML IJ SOLN
INTRAMUSCULAR | Status: DC | PRN
  Administered 2021-02-15: 10 mg via INTRAVENOUS

## 2021-02-15 MED ORDER — LIDOCAINE 2% (20 MG/ML) 5 ML SYRINGE
INTRAMUSCULAR | Status: DC | PRN
  Administered 2021-02-15: 60 mg via INTRAVENOUS

## 2021-02-15 MED ORDER — ONDANSETRON HCL 4 MG/2ML IJ SOLN
INTRAMUSCULAR | Status: DC | PRN
  Administered 2021-02-15: 4 mg via INTRAVENOUS

## 2021-02-15 MED ORDER — MIDAZOLAM HCL 2 MG/2ML IJ SOLN
INTRAMUSCULAR | Status: DC | PRN
  Administered 2021-02-15: 2 mg via INTRAVENOUS

## 2021-02-15 MED ORDER — ACETAMINOPHEN 325 MG PO TABS: 650.0000 mg | ORAL_TABLET | ORAL | Status: DC | PRN

## 2021-02-15 MED ORDER — THROMBIN 20000 UNITS EX SOLR
CUTANEOUS | Status: DC | PRN
  Administered 2021-02-15: 20 mL via TOPICAL

## 2021-02-15 MED ORDER — DEXMEDETOMIDINE (PRECEDEX) IN NS 20 MCG/5ML (4 MCG/ML) IV SYRINGE
PREFILLED_SYRINGE | INTRAVENOUS | Status: DC | PRN
  Administered 2021-02-15 (×2): 4 ug via INTRAVENOUS

## 2021-02-15 SURGICAL SUPPLY — 94 items
APPLIER CLIP 11 MED OPEN (CLIP) ×2
BAG COUNTER SPONGE SURGICOUNT (BAG) ×6 IMPLANT
BENZOIN TINCTURE PRP APPL 2/3 (GAUZE/BANDAGES/DRESSINGS) ×3 IMPLANT
BONE VIVIGEN FORMABLE 1.3CC (Bone Implant) IMPLANT
BONE VIVIGEN FORMABLE 10CC (Bone Implant) ×2 IMPLANT
BUR PRESCISION 1.7 ELITE (BURR) ×2 IMPLANT
CARTRIDGE OIL MAESTRO DRILL (MISCELLANEOUS) ×1 IMPLANT
CLIP APPLIE 11 MED OPEN (CLIP) ×1 IMPLANT
CLSR STERI-STRIP ANTIMIC 1/2X4 (GAUZE/BANDAGES/DRESSINGS) ×2 IMPLANT
CNTNR URN SCR LID CUP LEK RST (MISCELLANEOUS) ×1 IMPLANT
CONT SPEC 4OZ STRL OR WHT (MISCELLANEOUS) ×1
COVER MAYO STAND STRL (DRAPES) ×4 IMPLANT
COVER SURGICAL LIGHT HANDLE (MISCELLANEOUS) ×4 IMPLANT
DIFFUSER DRILL AIR PNEUMATIC (MISCELLANEOUS) ×2 IMPLANT
DRAPE C-ARM 42X72 X-RAY (DRAPES) ×4 IMPLANT
DRAPE C-ARMOR (DRAPES) ×2 IMPLANT
DRAPE POUCH INSTRU U-SHP 10X18 (DRAPES) ×4 IMPLANT
DRAPE SURG 17X23 STRL (DRAPES) ×16 IMPLANT
DRAPE UNIVERSAL PACK (DRAPES) ×3 IMPLANT
DURAPREP 26ML APPLICATOR (WOUND CARE) ×4 IMPLANT
ELECT BLADE 4.0 EZ CLEAN MEGAD (MISCELLANEOUS) ×4
ELECT BLADE 6.5 EXT (BLADE) ×2 IMPLANT
ELECT CAUTERY BLADE 6.4 (BLADE) ×4 IMPLANT
ELECT REM PT RETURN 9FT ADLT (ELECTROSURGICAL) ×4
ELECTRODE BLDE 4.0 EZ CLN MEGD (MISCELLANEOUS) ×2 IMPLANT
ELECTRODE REM PT RTRN 9FT ADLT (ELECTROSURGICAL) ×2 IMPLANT
FILTER STRAW FLUID ASPIR (MISCELLANEOUS) ×2 IMPLANT
GAUZE 4X4 16PLY ~~LOC~~+RFID DBL (SPONGE) ×4 IMPLANT
GAUZE SPONGE 4X4 12PLY STRL (GAUZE/BANDAGES/DRESSINGS) ×3 IMPLANT
GLOVE SRG 8 PF TXTR STRL LF DI (GLOVE) ×3 IMPLANT
GLOVE SURG ENC MOIS LTX SZ6.5 (GLOVE) ×6 IMPLANT
GLOVE SURG ENC MOIS LTX SZ7.5 (GLOVE) ×2 IMPLANT
GLOVE SURG ENC MOIS LTX SZ8 (GLOVE) ×4 IMPLANT
GLOVE SURG MICRO LTX SZ7.5 (GLOVE) ×2 IMPLANT
GLOVE SURG UNDER POLY LF SZ7 (GLOVE) ×4 IMPLANT
GLOVE SURG UNDER POLY LF SZ8 (GLOVE) ×3
GOWN STRL REUS W/ TWL LRG LVL3 (GOWN DISPOSABLE) ×3 IMPLANT
GOWN STRL REUS W/ TWL XL LVL3 (GOWN DISPOSABLE) ×4 IMPLANT
GOWN STRL REUS W/TWL LRG LVL3 (GOWN DISPOSABLE) ×3
GOWN STRL REUS W/TWL XL LVL3 (GOWN DISPOSABLE) ×4
GRAFT BNE MATRIX VG FRMBL L 10 (Bone Implant) IMPLANT
GRAFT BNE MATRIX VG FRMBL SM 1 (Bone Implant) IMPLANT
GUIDEWIRE SHARP VIPER II (WIRE) ×6 IMPLANT
IV CATH 14GX2 1/4 (CATHETERS) ×4 IMPLANT
KIT ALARA NEURO ACCESS (KITS) ×3 IMPLANT
KIT BASIN OR (CUSTOM PROCEDURE TRAY) ×4 IMPLANT
KIT POSITION SURG JACKSON T1 (MISCELLANEOUS) ×2 IMPLANT
KIT TURNOVER KIT B (KITS) ×4 IMPLANT
MARKER SKIN DUAL TIP RULER LAB (MISCELLANEOUS) ×8 IMPLANT
NDL 18GX1X1/2 (RX/OR ONLY) (NEEDLE) ×1 IMPLANT
NDL HYPO 25GX1X1/2 BEV (NEEDLE) ×2 IMPLANT
NDL SPNL 18GX3.5 QUINCKE PK (NEEDLE) ×3 IMPLANT
NEEDLE 18GX1X1/2 (RX/OR ONLY) (NEEDLE) ×2 IMPLANT
NEEDLE 22X1 1/2 (OR ONLY) (NEEDLE) ×4 IMPLANT
NEEDLE HYPO 25GX1X1/2 BEV (NEEDLE) ×4 IMPLANT
NEEDLE SPNL 18GX3.5 QUINCKE PK (NEEDLE) ×6 IMPLANT
NS IRRIG 1000ML POUR BTL (IV SOLUTION) ×6 IMPLANT
OIL CARTRIDGE MAESTRO DRILL (MISCELLANEOUS) ×2
PACK LAMINECTOMY ORTHO (CUSTOM PROCEDURE TRAY) ×4 IMPLANT
PACK UNIVERSAL I (CUSTOM PROCEDURE TRAY) ×4 IMPLANT
PAD ARMBOARD 7.5X6 YLW CONV (MISCELLANEOUS) ×12 IMPLANT
PATTIES SURGICAL .5 X1 (DISPOSABLE) ×2 IMPLANT
PATTIES SURGICAL .5X1.5 (GAUZE/BANDAGES/DRESSINGS) ×2 IMPLANT
ROD VIPER2 5.5X45 PRE LARDOSED (Rod) ×2 IMPLANT
SCREW POLY VIPER2 7X40MM (Screw) ×2 IMPLANT
SCREW SET SINGLE INNER MIS (Screw) ×4 IMPLANT
SCREW VIPER II XTAB POLY 7X45M (Screw) ×2 IMPLANT
SPACER ALIF EXP 26X34 15D (Spacer) ×1 IMPLANT
SPONGE INTESTINAL PEANUT (DISPOSABLE) ×8 IMPLANT
SPONGE SURGIFOAM ABS GEL 100 (HEMOSTASIS) ×2 IMPLANT
SPONGE T-LAP 18X18 ~~LOC~~+RFID (SPONGE) ×3 IMPLANT
SPONGE T-LAP 4X18 ~~LOC~~+RFID (SPONGE) ×6 IMPLANT
STAPLER VISISTAT 35W (STAPLE) ×2 IMPLANT
STRIP CLOSURE SKIN 1/2X4 (GAUZE/BANDAGES/DRESSINGS) ×5 IMPLANT
SUT MNCRL AB 4-0 PS2 18 (SUTURE) ×5 IMPLANT
SUT PDS AB 1 CTX 36 (SUTURE) ×3 IMPLANT
SUT SILK 0 TIES 10X30 (SUTURE) ×3 IMPLANT
SUT SILK 3 0SH CR/8 30 (SUTURE) ×1 IMPLANT
SUT VIC AB 0 CT1 18XCR BRD 8 (SUTURE) ×2 IMPLANT
SUT VIC AB 0 CT1 8-18 (SUTURE) ×1
SUT VIC AB 1 CT1 18XCR BRD 8 (SUTURE) ×2 IMPLANT
SUT VIC AB 1 CT1 8-18 (SUTURE) ×2
SUT VIC AB 2-0 CT2 18 VCP726D (SUTURE) ×4 IMPLANT
SYR 20ML LL LF (SYRINGE) ×4 IMPLANT
SYR BULB IRRIG 60ML STRL (SYRINGE) ×4 IMPLANT
SYR CONTROL 10ML LL (SYRINGE) ×6 IMPLANT
SYR TB 1ML LUER SLIP (SYRINGE) ×2 IMPLANT
TAP CANN VIPER2 DL 6.0 (TAP) ×2 IMPLANT
TAPE CLOTH SURG 4X10 WHT LF (GAUZE/BANDAGES/DRESSINGS) ×2 IMPLANT
TOWEL GREEN STERILE (TOWEL DISPOSABLE) ×5 IMPLANT
TOWEL GREEN STERILE FF (TOWEL DISPOSABLE) ×2 IMPLANT
TRAY FOLEY MTR SLVR 16FR STAT (SET/KITS/TRAYS/PACK) ×3 IMPLANT
WATER STERILE IRR 1000ML POUR (IV SOLUTION) ×4 IMPLANT
YANKAUER SUCT BULB TIP NO VENT (SUCTIONS) ×4 IMPLANT

## 2021-02-15 NOTE — H&P (Signed)
PREOPERATIVE H&P  Chief Complaint: Bilateral leg pain  HPI: April Montoya is a 30 y.o. female who presents with ongoing pain in the bilateral legs  MRI reveals NF stenosis and instability at L5/S1  Patient has failed multiple forms of conservative care and continues to have pain (see office notes for additional details regarding the patient's full course of treatment)  Past Medical History:  Diagnosis Date   Annular tear    Asthma    DDD (degenerative disc disease), lumbar    L5-S1   Headache    Low back pain    Increased with sitting   Pars defect    Bilateral   Spondylolisthesis    Past Surgical History:  Procedure Laterality Date   DILATION AND CURETTAGE OF UTERUS     TONSILLECTOMY     Social History   Socioeconomic History   Marital status: Single    Spouse name: Not on file   Number of children: Not on file   Years of education: Not on file   Highest education level: Not on file  Occupational History   Not on file  Tobacco Use   Smoking status: Every Day   Smokeless tobacco: Never  Vaping Use   Vaping Use: Never used  Substance and Sexual Activity   Alcohol use: Not Currently   Drug use: Yes    Types: Marijuana    Comment: once a day   Sexual activity: Not on file  Other Topics Concern   Not on file  Social History Narrative   Not on file   Social Determinants of Health   Financial Resource Strain: Not on file  Food Insecurity: Not on file  Transportation Needs: Not on file  Physical Activity: Not on file  Stress: Not on file  Social Connections: Not on file   History reviewed. No pertinent family history. Allergies  Allergen Reactions   Sulfamethoxazole-Trimethoprim Hives and Rash   Sulfa Antibiotics Hives, Rash and Swelling   Prior to Admission medications   Medication Sig Start Date End Date Taking? Authorizing Provider  albuterol (VENTOLIN HFA) 108 (90 Base) MCG/ACT inhaler Inhale 1-2 puffs into the lungs every 6 (six) hours as  needed for shortness of breath or wheezing. 02/17/20  Yes [provider]  Cyanocobalamin (B-12 PO) Take 1 tablet by mouth daily.   Yes [provider]  diclofenac (VOLTAREN) 75 MG EC tablet Take 75 mg by mouth 2 (two) times daily. 11/08/20  Yes [provider]  etonogestrel (NEXPLANON) 68 MG IMPL implant 68 mg by Subdermal route once.   Yes [provider]  Multiple Vitamin (MULTIVITAMIN WITH MINERALS) TABS tablet Take 1 tablet by mouth daily.   Yes [provider]     All other systems have been reviewed and were otherwise negative with the exception of those mentioned in the HPI and as above.  Physical Exam: Vitals:   02/15/21 0541  BP: (!) 119/58  Pulse: 69  Resp: 17  Temp: 98.1 F (36.7 C)  SpO2: 99%    Body mass index is 21.22 kg/m.  General: Alert, no acute distress Cardiovascular: No pedal edema Respiratory: No cyanosis, no use of accessory musculature Skin: No lesions in the area of chief complaint Neurologic: Sensation intact distally Psychiatric: Patient is competent for consent with normal mood and affect Lymphatic: No axillary or cervical lymphadenopathy   Assessment/Plan: DEGENERATIVE DISC DISEASE, LUMBAR RADICULOPATHY Plan for Procedure(s): ANTERIOR LUMBAR INTERBODY FUSION LUMBAR 5 - SACRUM 1 WITH INSTRUMENTATION  AND ALLOGRAFT, POSTERIOR SPINAL FUSION LUMBAR 5 - SACRUM 1 WITH INSTRUMENTATION AND ALLOGRAFT, ABDOMINAL EXPOSURE   Jackelyn Hoehn, MD 02/15/2021 6:37 AM

## 2021-02-15 NOTE — Op Note (Signed)
PATIENT NAME: April Montoya   MEDICAL RECORD NO.:   [id number]    DATE OF BIRTH: 11/09/1990   DATE OF PROCEDURE: 02/15/2021                              OPERATIVE REPORT   PREOPERATIVE DIAGNOSES: 1. Bilateral lumbar radiculopathy. 2. L5/S1 spondylolisthesis 3. Bilateral neuroforaminal stenosis, L5/S1 4. Bilateral L5 pars defect  POSTOPERATIVE DIAGNOSES: 1. Bilateral lumbar radiculopathy. 2. L5/S1 spondylolisthesis 3. Bilateral neuroforaminal stenosis, L5/S1 4. Bilateral L5 pars defect  PROCEDURE: 1. Anterior lumbar interbody fusion, L5-S1 (exposure peformed by Dr. Clotilde Dieter) 2. Insertion of interbody device x1 (Globus Magnify expandable intervertebral spacer) 3. Intraoperative use of fluoroscopy. 4. Use of morselized allograft - Vivigen 5. L5/S1 posterior spinal fusion 6. Placement of posterior instrumentation, L5/S1  SURGEON:  Estill Bamberg, MD  ASSISTANT:  Jason Coop, PA-C  ANESTHESIA:  General endotracheal anesthesia.  COMPLICATIONS:  None.  DISPOSITION:  Stable.  ESTIMATED BLOOD LOSS:  minimal  INDICATIONS FOR SURGERY:  Briefly, April Montoya is a very pleasant 30 year old female, who has been having progressive debilitating pain in the bilateral legs, s/p a work injur dated 12/27/2019. The patient did fail appropriate nonoperative measures, but did continue to have significant pain. Given her ongoing pain and dysfunction, and imaging studies, we did discuss proceeding with the procedure noted above.  The patient was fully aware of the risks and limitations associated with surgery, and did wish to proceed.     OPERATIVE DETAILS:  On 02/15/2021, the patient was brought to surgery and general endotracheal anesthesia was administered.  The patient was placed supine on the hospital bed.  The patient's abdomen was prepped and draped in the usual sterile fashion.  An anterior retroperitoneal approach was then performed by Dr. Clotilde Dieter. Once the anterior lumbar spine  was noted, we did focus our attention on the L5-S1 intervertebral space.  I then performed a thorough and complete L5-S1 intervertebral diskectomy to the level of the posterior longitudinal ligament. I was very pleased with the diskectomy and decompression that I was able to accomplish.  The endplates were then appropriately prepared and the appropriate sized anterior intervertebral spacer was packed with Vivigen and tamped into position. A 10 mm implant was inserted, and this was expanded to approximately 12.8 mm in height. I was very pleased with the press-fit of the implant.  I did liberally use AP and lateral fluoroscopy to ensure that the implant was in the appropriate position, and was very pleased with the radiographs. The wound was copiously irrigated.  The fascia was closed using #1 PDS.  The subcutaneous layer was closed using 0 Vicryl followed by 2-0 Vicryl, and the skin was closed using 4-0 Monocryl. Benzoin and Steri-Strips were applied followed by sterile dressing.    At this point, the patient was placed prone onto a Jackson spinal frame.  The back was then prepped and draped in the usual sterile fashion.  AP and lateral fluoroscopy was brought into the field.  The L5 and S1 levels were marked out.  Paramedian incisions were then made bilaterally, just lateral to the region of the L5 and S1 pedicles.  On the left side, the left L5-S1 facet joint and posterolateral gutter was subperiosteally exposed and decorticated, and the remainder of the Vivigen was packed into the posterolateral gutter, to help aid in the success of the fusion.  At this point, Jamshidis were advanced across the L5 and  S1 pedicles bilaterally.  I did liberally use AP and lateral fluoroscopy, in order to ensure a safe trajectory of the Jamshidis.  I then placed guidewires through the Jamshidis.  Then, I used a 6 mm tap over the guidewires, followed by the placement of 7 x 40 mm pedicle screws bilaterally at L5 and 7 x 35  mm screws were placed at S1.  The guidewires were then removed.  I was very pleased with the resting position of the pedicle screws.  At this point, 45 mm rods were secured into the tulip heads of the screws.  Caps were placed and a final locking procedure was performed.  I was very pleased with the final AP and lateral fluoroscopic images.  The wounds were then copiously irrigated.  The wounds were then closed using #1 Vicryl, followed by 2-0 Vicryl, followed by 4-0 Monocryl. All instrument counts were correct at the termination of the procedure.   Of note, Jason Coop was my assistant throughout surgery, and did aid in retraction, suctioning, placement of the hardware, and closure for both the anterior and posterior portions of the procedure.   Estill Bamberg, MD

## 2021-02-15 NOTE — Op Note (Signed)
Date: February 15, 2021  Preoperative diagnosis: Chronic lower back pain  Postoperative diagnosis: Same  Procedure: Anterior spine exposure at the L5-S1 disc space via anterior retroperitoneal approach for L5-S1 ALIF  Surgeon: Dr. Cephus Shelling, MD  Co-surgeon: Dr. Estill Bamberg, MD  Assistant: Jason Coop, PA  Indications: Patient is a 30 year old female with ongoing chronic lower back pain with radiculopathy and has failed conservative management.  She has been evaluated by Dr. Yevette Edwards who has recommended an L5-S1 ALIF and vascular surgery was asked to assist with anterior spine exposure.  She presents today after risks benefits discussed.  Findings: Transverse incision was made over the left rectus muscle where the L5-S1 disc space was marked.  I dissected down and opened the anterior rectus sheath and the rectus muscle was mobilized to the midline.  I entered the retroperitoneum and the peritoneum and left ureter were mobilized across the disc space anteriorly toward the midline.  Middle sacral vessels were divided between clips and ties.  I got good working space on both sides of the disc space.  Fixed Thompson retractor was placed.  We confirmed we were at the correct level on lateral fluoroscopy.  Anesthesia: General  Details: Patient was taken to the operating room after informed consent was obtained.  Placed on the operating table in the supine position.  General endotracheal anesthesia was induced.  Fluoroscopic C-arm was brought in the lateral position and the L5-S1 disc space was marked over the left abdominal wall over the rectus muscle.  The abdominal wall was then prepped and draped in standard sterile fashion.  Antibiotics were given.  Timeout was performed.  Initially made a transverse incision over the left rectus muscle where our preoperative mark was located.  Dissected down with Bovie cautery and opened subcutaneous tissue until we encountered anterior rectus  sheath.  Cerebellar retractors were placed for better visualization.  Anterior rectus sheath was opened transversely with Bovie cautery.  We then raised flaps underneath the anterior rectus sheath.  The left rectus muscle was mobilized toward the midline.  I entered the retroperitoneum and the peritoneum and left ureter were mobilized to the midline exposing the disc base from the front.  I then got a right angle clamp around the middle sacral vessels that were divided between clips and ties.  Use then used Kd and suction to bluntly mobilize on both sides of the disc space.  Once we had good working room a fixed Best boy was placed and I placed 140 reverse lip retractors on each side of the disc space with malleable retractors cranial caudal.  I then placed a spinal needle in the disc space and confirmed on lateral fluoroscopy that we were at the correct L5-S1 level.  Case was turned over to Dr. Yevette Edwards.  Please see his dictation for remainder of the case.  Complication: None  Condition: Stable  Cephus Shelling, MD [facility] [place] [facility] Office: [phone]   Cephus Shelling

## 2021-02-15 NOTE — Anesthesia Postprocedure Evaluation (Signed)
Anesthesia Post Note  Patient: Designer, fashion/clothing  Procedure(s) Performed: ANTERIOR LUMBAR INTERBODY FUSION LUMBAR 5 - SACRUM 1 WITH INSTRUMENTATION AND ALLOGRAFT POSTERIOR SPINAL FUSION LUMBAR 5 - SACRUM 1 WITH INSTRUMENTATION AND ALLOGRAFT ABDOMINAL EXPOSURE     Patient location during evaluation: PACU Anesthesia Type: General Level of consciousness: awake Pain management: pain level controlled Vital Signs Assessment: post-procedure vital signs reviewed and stable Respiratory status: spontaneous breathing and respiratory function stable Cardiovascular status: stable Postop Assessment: no apparent nausea or vomiting Anesthetic complications: no   No notable events documented.  Last Vitals:  Vitals:   02/15/21 1310 02/15/21 1349  BP: (!) 99/59 109/67  Pulse: 74 79  Resp: 20 20  Temp: (!) 36.3 C (!) 36.4 C  SpO2: 98% 100%    Last Pain:  Vitals:   02/15/21 1349  TempSrc: Oral  PainSc:                  Mellody Dance

## 2021-02-15 NOTE — H&P (Signed)
History and Physical Interval Note:  02/15/2021 7:32 AM  April Montoya  has presented today for surgery, with the diagnosis of DEGENERATIVE DISC DISEASE.  The various methods of treatment have been discussed with the patient and family. After consideration of risks, benefits and other options for treatment, the patient has consented to  Procedure(s): ANTERIOR LUMBAR INTERBODY FUSION LUMBAR 5 - SACRUM 1 WITH INSTRUMENTATION AND ALLOGRAFT (N/A) POSTERIOR SPINAL FUSION LUMBAR 5 - SACRUM 1 WITH INSTRUMENTATION AND ALLOGRAFT (N/A) ABDOMINAL EXPOSURE (N/A) as a surgical intervention.  The patient's history has been reviewed, patient examined, no change in status, stable for surgery.  I have reviewed the patient's chart and labs.  Questions were answered to the patient's satisfaction.    L5-S1 ALIF  Cephus Shelling  Patient name: April Montoya  MRN: [id number]        DOB: 02-02-1991            Sex: female   REASON FOR CONSULT: Evaluate for L5-S1 ALIF   HPI: April Montoya is a 30 y.o. female, with degenerative disc disease and asthma that presents for evaluation of L5-S1 ALIF.  Patient states she had an injury at work last year when she was working [employer].  She has had chronic back pain since that injury.  Ultimately she has been under the care of Dr. Yevette Edwards and has failed conservative management.  He has recommended an anterior L5-S1 fusion and vascular surgery was asked to evaluate her for abdominal exposure.  She states she has had no previous abdominal surgery.  She has had a D&C that was transvaginal.  She does smoke cigarettes.       Past Medical History:  Diagnosis Date   Annular tear     DDD (degenerative disc disease), lumbar      L5-S1   Low back pain      Increased with sitting   Pars defect      Bilateral   Spondylolisthesis        Past surgical history: Denies   No family history on file.   SOCIAL HISTORY: Social History         Socioeconomic  History   Marital status: Single      Spouse name: Not on file   Number of children: Not on file   Years of education: Not on file   Highest education level: Not on file  Occupational History   Not on file  Tobacco Use   Smoking status: Not on file   Smokeless tobacco: Not on file  Substance and Sexual Activity   Alcohol use: Not on file   Drug use: Not on file   Sexual activity: Not on file  Other Topics Concern   Not on file  Social History Narrative   Not on file    Social Determinants of Health    Financial Resource Strain: Not on file  Food Insecurity: Not on file  Transportation Needs: Not on file  Physical Activity: Not on file  Stress: Not on file  Social Connections: Not on file  Intimate Partner Violence: Not on file      Not on File         Current Outpatient Medications  Medication Sig Dispense Refill   diclofenac (VOLTAREN) 75 MG EC tablet Take 75 mg by mouth 2 (two) times daily.       ondansetron (ZOFRAN-ODT) 4 MG disintegrating tablet Take by mouth.        No current facility-administered medications  for this visit.      REVIEW OF SYSTEMS:  [X]  denotes positive finding, [ ]  denotes negative finding Cardiac   Comments:  Chest pain or chest pressure:      Shortness of breath upon exertion:      Short of breath when lying flat:      Irregular heart rhythm:             Vascular      Pain in calf, thigh, or hip brought on by ambulation:      Pain in feet at night that wakes you up from your sleep:       Blood clot in your veins:      Leg swelling:              Pulmonary      Oxygen at home:      Productive cough:       Wheezing:              Neurologic      Sudden weakness in arms or legs:       Sudden numbness in arms or legs:       Sudden onset of difficulty speaking or slurred speech:      Temporary loss of vision in one eye:       Problems with dizziness:              Gastrointestinal      Blood in stool:       Vomited blood:               Genitourinary      Burning when urinating:       Blood in urine:             Psychiatric      Major depression:              Hematologic      Bleeding problems:      Problems with blood clotting too easily:             Skin      Rashes or ulcers:             Constitutional      Fever or chills:          PHYSICAL EXAM: There were no vitals filed for this visit.   GENERAL: The patient is a well-nourished female, in no acute distress. The vital signs are documented above. CARDIAC: There is a regular rate and rhythm.  VASCULAR:  2+ palpable femoral pulses bilaterally 2+ palpable PT pulses bilaterally PULMONARY: No respiratory distress. ABDOMEN: Soft and non-tender.  No previous incisions. MUSCULOSKELETAL: There are no major deformities or cyanosis. NEUROLOGIC: No focal weakness or paresthesias are detected. SKIN: There are no ulcers or rashes noted. PSYCHIATRIC: The patient has a normal affect.   DATA:    MRI reviewed 12/20/2020 and L5-S1 disc space as well as vessels visualized   Assessment/Plan:   30 year old female presents with chronic lower back pain for preop evaluation of planned L5-S1 ALIF with Dr. 12/22/2020.  Vascular surgery has been asked to assist with anterior abdominal exposure.  She has no previous abdominal surgical history and I reviewed her MRI.  I think she should be a good candidate for anterior approach.  I discussed transverse incision over the L5-S1 disc space and mobilizing the rectus and then entering the retroperitoneum to mobilize the peritoneum and ureter across midline.  We discussed mobilizing iliac artery  and vein.  Discussed risk of injury to the above structures.  All questions answered.     Cephus Shelling, MD [facility] [place] Office: [phone]

## 2021-02-15 NOTE — Transfer of Care (Signed)
Immediate Anesthesia Transfer of Care Note  Patient: April Montoya  Procedure(s) Performed: ANTERIOR LUMBAR INTERBODY FUSION LUMBAR 5 - SACRUM 1 WITH INSTRUMENTATION AND ALLOGRAFT POSTERIOR SPINAL FUSION LUMBAR 5 - SACRUM 1 WITH INSTRUMENTATION AND ALLOGRAFT ABDOMINAL EXPOSURE  Patient Location: PACU  Anesthesia Type:General  Level of Consciousness: awake, alert  and patient cooperative  Airway & Oxygen Therapy: Patient Spontanous Breathing and Patient connected to face mask oxygen  Post-op Assessment: Report given to RN, Post -op Vital signs reviewed and stable and Patient moving all extremities X 4  Post vital signs: Reviewed and stable  Last Vitals:  Vitals Value Taken Time  BP 108/68 02/15/21 1156  Temp    Pulse 72 02/15/21 1157  Resp 13 02/15/21 1157  SpO2 100 % 02/15/21 1157  Vitals shown include unvalidated device data.  Last Pain:  Vitals:   02/15/21 0651  TempSrc:   PainSc: 9       Patients Stated Pain Goal: 4 (02/15/21 0651)  Complications: No notable events documented.

## 2021-02-15 NOTE — Anesthesia Procedure Notes (Signed)
Procedure Name: Intubation Date/Time: 02/15/2021 7:49 AM Performed by: Annamary Carolin, CRNA Pre-anesthesia Checklist: Patient identified, Emergency Drugs available, Suction available and Patient being monitored Patient Re-evaluated:Patient Re-evaluated prior to induction Oxygen Delivery Method: Circle System Utilized Preoxygenation: Pre-oxygenation with 100% oxygen Induction Type: IV induction Ventilation: Mask ventilation without difficulty Laryngoscope Size: 3 and Mac Grade View: Grade I Tube type: Oral Tube size: 7.0 mm Number of attempts: 1 Airway Equipment and Method: Stylet Placement Confirmation: ETT inserted through vocal cords under direct vision, positive ETCO2 and breath sounds checked- equal and bilateral Tube secured with: Tape Dental Injury: Teeth and Oropharynx as per pre-operative assessment  Comments: With ease

## 2021-02-16 ENCOUNTER — Encounter (HOSPITAL_COMMUNITY): Payer: Self-pay | Admitting: Orthopedic Surgery

## 2021-02-16 MED ORDER — METHOCARBAMOL 500 MG PO TABS: 500.0000 mg | ORAL_TABLET | Freq: Four times a day (QID) | ORAL | 0 refills | Status: AC | PRN

## 2021-02-16 MED ORDER — POLYETHYLENE GLYCOL 3350 17 G PO PACK
17.0000 g | PACK | Freq: Every day | ORAL | Status: DC
  Administered 2021-02-16: 17 g via ORAL
  Filled 2021-02-16: qty 1

## 2021-02-16 MED ORDER — POLYETHYLENE GLYCOL 3350 17 G PO PACK
17.0000 g | PACK | Freq: Every day | ORAL | Status: DC | PRN
  Administered 2021-02-16: 17 g via ORAL

## 2021-02-16 MED ORDER — OXYCODONE-ACETAMINOPHEN 5-325 MG PO TABS: 1.0000 | ORAL_TABLET | ORAL | 0 refills | Status: AC | PRN

## 2021-02-16 MED FILL — Thrombin (Recombinant) For Soln 20000 Unit: CUTANEOUS | Qty: 1 | Status: AC

## 2021-02-16 NOTE — Plan of Care (Signed)
Pt. discharged home accompanied by mother. Prescriptions and discharge instructions given with verbalization of understanding. Incision site on back with no s/s of infection - no swelling, redness, bleeding, and/or drainage noted.  Patient states pain is manageable at time of discharge. Patient has an appointment with MD in 2 weeks

## 2021-02-16 NOTE — Evaluation (Signed)
Occupational Therapy Evaluation Patient Details Name: April Montoya MRN: [id number] DOB: [birth year] Today's Date: 02/16/2021   History of Present Illness 30 yo F s/p L5-S1 ANT/POST fusion.  PMH includes: Annular tear, DDD lumbar, L5-S1 Low back pain, Spondylolisthesis.   Clinical Impression   Patient admitted for the diagnosis and procedure above.  PTA she was working full time, and needed no assist for ADL or mobility.  Currently she has expected post surgical discomfort, but is essentially at her baseline.  Good adherence to all back precautions, all questions answered, and no further acute OT needed.        Recommendations for follow up therapy are one component of a multi-disciplinary discharge planning process, led by the attending physician.  Recommendations may be updated based on patient status, additional functional criteria and insurance authorization.   Follow Up Recommendations  No OT follow up    Equipment Recommendations  None recommended by OT    Recommendations for Other Services       Precautions / Restrictions Precautions Precautions: Back Precaution Booklet Issued: Yes (comment) Precaution Comments: Issued by PT, reviewed. Required Braces or Orthoses: Spinal Brace Spinal Brace: Thoracolumbosacral orthotic Restrictions Weight Bearing Restrictions: No      Mobility Bed Mobility               General bed mobility comments: up in the recliner Patient Response: Cooperative  Transfers Overall transfer level: Independent                    Balance Overall balance assessment: No apparent balance deficits (not formally assessed)                                         ADL either performed or assessed with clinical judgement   ADL Overall ADL's : At baseline                                       General ADL Comments: Able to complete ADL from sit/stand level without assist, and good adherence to back  precautions.  Up and walking ad lib in the room.     Vision Baseline Vision/History: 1 Wears glasses Patient Visual Report: No change from baseline       Perception     Praxis      Pertinent Vitals/Pain Pain Assessment: 0-10 Pain Score: 4  Pain Location: surgical site Pain Descriptors / Indicators: Operative site guarding Pain Intervention(s): Monitored during session     Hand Dominance Right   Extremity/Trunk Assessment Upper Extremity Assessment Upper Extremity Assessment: Overall WFL for tasks assessed   Lower Extremity Assessment Lower Extremity Assessment: Defer to PT evaluation   Cervical / Trunk Assessment Cervical / Trunk Assessment: Other exceptions Cervical / Trunk Exceptions: A/P fusion   Communication Communication Communication: No difficulties   Cognition Arousal/Alertness: Awake/alert Behavior During Therapy: WFL for tasks assessed/performed Overall Cognitive Status: Within Functional Limits for tasks assessed                                                      Home Living Family/patient expects to be discharged to:: Private residence Living Arrangements:  Alone Available Help at Discharge: Family;Friend(s);Available PRN/intermittently Type of Home: House Home Access: Stairs to enter [employer] of Steps: 3   Home Layout: One level     Bathroom Shower/Tub: Chief Strategy Officer: Standard     Home Equipment: None          Prior Functioning/Environment Level of Independence: Independent                 OT Problem List: Pain      OT Treatment/Interventions:      OT Goals(Current goals can be found in the care plan section) Acute Rehab OT Goals Patient Stated Goal: Return home and recover OT Goal Formulation: With patient Time For Goal Achievement: 02/16/21 Potential to Achieve Goals: Good  OT Frequency:     Barriers to D/C:  None          Co-evaluation               AM-PAC OT "6 Clicks" Daily Activity     Outcome Measure Help from another person eating meals?: None Help from another person taking care of personal grooming?: None Help from another person toileting, which includes using toliet, bedpan, or urinal?: None Help from another person bathing (including washing, rinsing, drying)?: None Help from another person to put on and taking off regular upper body clothing?: None Help from another person to put on and taking off regular lower body clothing?: None 6 Click Score: 24   End of Session Equipment Utilized During Treatment: Back brace  Activity Tolerance: Patient tolerated treatment well Patient left: in chair  OT Visit Diagnosis: Pain                Time: 9562-1308 OT Time Calculation (min): 16 min Charges:  OT General Charges $OT Visit: 1 Visit OT Evaluation $OT Eval Moderate Complexity: 1 Mod  02/16/2021  RP, OTR/L  Acute Rehabilitation Services  Office:  [phone]   April Montoya 02/16/2021, 8:52 AM

## 2021-02-16 NOTE — Evaluation (Signed)
Physical Therapy Evaluation Patient Details Name: April Montoya MRN: [id number] DOB: [birth year] Today's Date: 02/16/2021  History of Present Illness  30 yo F s/p L5-S1 ANT/POST fusion.  PMH includes: Annular tear, DDD lumbar, L5-S1 Low back pain, Spondylolisthesis.  Clinical Impression  Pt presents to PT s/p ALIF/PLIF. Pt demonstrates deficits in gait, balance, and power compared to baseline but remains able to mobilize independently at this time. PT encourages the pt to frequently ambulate to improve activity tolerance and reduce stiffness. Pt verbalizes back precautions and demonstrates the ability to don brace independently. Pt requires no further acute PT services. Acute PT signing off.       Recommendations for follow up therapy are one component of a multi-disciplinary discharge planning process, led by the attending physician.  Recommendations may be updated based on patient status, additional functional criteria and insurance authorization.  Follow Up Recommendations No PT follow up    Equipment Recommendations  None recommended by PT    Recommendations for Other Services       Precautions / Restrictions Precautions Precautions: Back Precaution Booklet Issued: Yes (comment) Precaution Comments: Issued by PT, reviewed. Required Braces or Orthoses: Spinal Brace Spinal Brace: Thoracolumbosacral orthotic Restrictions Weight Bearing Restrictions: No      Mobility  Bed Mobility Overal bed mobility: Independent             General bed mobility comments: up in the recliner    Transfers Overall transfer level: Independent Equipment used: None                Ambulation/Gait Ambulation/Gait assistance: Independent Gait Distance (Feet): 400 Feet Assistive device: None Gait Pattern/deviations: WFL(Within Functional Limits) Gait velocity: functional Gait velocity interpretation: >2.62 ft/sec, indicative of community ambulatory General Gait Details: steady  step-through gait  Stairs Stairs: Yes Stairs assistance: Modified independent (Device/Increase time) Stair Management: One rail Left;Alternating pattern Number of Stairs: 10    Wheelchair Mobility    Modified Rankin (Stroke Patients Only)       Balance Overall balance assessment: No apparent balance deficits (not formally assessed)                                           Pertinent Vitals/Pain Pain Assessment: 0-10 Pain Score: 4  Pain Location: surgical site Pain Descriptors / Indicators: Operative site guarding Pain Intervention(s): Monitored during session    Home Living Family/patient expects to be discharged to:: Private residence Living Arrangements: Alone Available Help at Discharge: Family;Friend(s);Available PRN/intermittently Type of Home: House Home Access: Stairs to enter Entrance Stairs-Rails: Can reach both Entrance Stairs-Number of Steps: 3 Home Layout: One level Home Equipment: None      Prior Function Level of Independence: Independent         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   Dominant Hand: Right    Extremity/Trunk Assessment   Upper Extremity Assessment Upper Extremity Assessment: Overall WFL for tasks assessed    Lower Extremity Assessment Lower Extremity Assessment: Defer to PT evaluation    Cervical / Trunk Assessment Cervical / Trunk Assessment: Other exceptions Cervical / Trunk Exceptions: A/P fusion  Communication   Communication: No difficulties  Cognition Arousal/Alertness: Awake/alert Behavior During Therapy: WFL for tasks assessed/performed Overall Cognitive Status: Within Functional Limits for tasks assessed  General Comments General comments (skin integrity, edema, etc.): VSS on RA    Exercises     Assessment/Plan    PT Assessment Patent does not need any further PT services  PT Problem List          PT Treatment Interventions      PT Goals (Current goals can be found in the Care Plan section)  Acute Rehab PT Goals Patient Stated Goal: Return home and recover    Frequency     Barriers to discharge        Co-evaluation               AM-PAC PT "6 Clicks" Mobility  Outcome Measure Help needed turning from your back to your side while in a flat bed without using bedrails?: None Help needed moving from lying on your back to sitting on the side of a flat bed without using bedrails?: None Help needed moving to and from a bed to a chair (including a wheelchair)?: None Help needed standing up from a chair using your arms (e.g., wheelchair or bedside chair)?: None Help needed to walk in hospital room?: None Help needed climbing 3-5 steps with a railing? : None 6 Click Score: 24    End of Session Equipment Utilized During Treatment: Back brace Activity Tolerance: Patient tolerated treatment well Patient left: in chair;with call bell/phone within reach Nurse Communication: Mobility status PT Visit Diagnosis: Other abnormalities of gait and mobility (R26.89)    Time: 3419-3790 PT Time Calculation (min) (ACUTE ONLY): 14 min   Charges:   PT Evaluation $PT Eval Low Complexity: 1 Low          April Montoya, PT, DPT Acute Rehabilitation Pager: [phone]   April Montoya 02/16/2021, 9:17 AM

## 2021-02-16 NOTE — Progress Notes (Signed)
[facility]  Subjective  -no complaints.  Tolerating liquids.   Objective 105/62 62 98.6 F (37 C) (Oral) 18 100%  Intake/Output Summary (Last 24 hours) at 02/16/2021 0815 Last data filed at 02/16/2021 0251 Gross per 24 hour  Intake 1100 ml  Output 400 ml  Net 700 ml    Appropriate postop incisional tenderness.  Incision clean dry and intact.  Palpable pedal pulse in the left foot.  Laboratory Lab Results: Recent Labs    02/15/21 0545  HGB 11.9*  HCT 36.8   BMET No results for input(s): NA, K, CL, CO2, GLUCOSE, BUN, CREATININE, CALCIUM in the last 72 hours.  COAG Lab Results  Component Value Date   INR 1.1 02/08/2021   No results found for: PTT  Assessment/Planning:  Postop day 1 status post L5-S1 ALIF with abdominal exposure.  Looks good this morning with appropriate postop incisional tenderness.  Palpable pedal pulse.  Looks good from vascular standpoint.  Cephus Shelling, MD [facility] Office: [phone]     Cephus Shelling 02/16/2021 8:15 AM --

## 2021-02-16 NOTE — Progress Notes (Signed)
    Patient doing well PO day one. Resolved pre-op BIL leg pain. Pt had some mild numbness in left leg yesterday but improving today. Has been up and walking and tolerating PO well. NL B/B function. TLSO at bedside   Physical Exam: Vitals:   02/15/21 1935 02/15/21 2303  BP: 132/87 105/62  Pulse: 68 62  Resp: 18 18  Temp: 98.6 F (37 C) 98.6 F (37 C)  SpO2: 100% 100%    Dressing in place, CDI, pt resting comfortably in bed NVI  POD #1 s/p L5-S1 ANT/POST fusion doing well with resolved pre-op pain and expected PO LBP and abdominal soreness from exposure  - up with PT/OT, encourage ambulation - Percocet for pain, Robaxin for muscle spasms  - D/C scripts sent to pharmacy [facility] - likely d/c home today with f/u in 2 weeks

## 2021-02-19 ENCOUNTER — Encounter (HOSPITAL_COMMUNITY): Payer: Self-pay | Admitting: Orthopedic Surgery

## 2021-02-21 MED FILL — Heparin Sodium (Porcine) Inj 1000 Unit/ML: INTRAMUSCULAR | Qty: 30 | Status: AC

## 2021-02-21 MED FILL — Sodium Chloride IV Soln 0.9%: INTRAVENOUS | Qty: 1000 | Status: AC

## 2021-03-08 NOTE — Discharge Summary (Signed)
Patient ID: April Montoya MRN: [id number] DOB/AGE: 08-Aug-1990 30 y.o.  Admit date: 02/15/2021 Discharge date: 02/16/2021  Admission Diagnoses:  Active Problems:   Radiculopathy   Discharge Diagnoses:  Same  Past Medical History:  Diagnosis Date   Annular tear    Asthma    DDD (degenerative disc disease), lumbar    L5-S1   Headache    Low back pain    Increased with sitting   Pars defect    Bilateral   PONV (postoperative nausea and vomiting)    pt reports that she gets panic attacks and "asthma kicks in" when waking up from anesthesia   Spondylolisthesis     Surgeries: Procedure(s): ANTERIOR LUMBAR INTERBODY FUSION LUMBAR 5 - SACRUM 1 WITH INSTRUMENTATION AND ALLOGRAFT POSTERIOR SPINAL FUSION LUMBAR 5 - SACRUM 1 WITH INSTRUMENTATION AND ALLOGRAFT ABDOMINAL EXPOSURE on 02/15/2021   Consultants: Treatment Team:  Cephus Shelling, MD  Discharged Condition: Improved  Hospital Course: April Montoya is an 30 y.o. female who was admitted 02/15/2021 for operative treatment of radiculopathy. Patient has severe unremitting pain that affects sleep, daily activities, and work/hobbies. After pre-op clearance the patient was taken to the operating room on 02/15/2021 and underwent  Procedure(s): ANTERIOR LUMBAR INTERBODY FUSION LUMBAR 5 - SACRUM 1 WITH INSTRUMENTATION AND ALLOGRAFT POSTERIOR SPINAL FUSION LUMBAR 5 - SACRUM 1 WITH INSTRUMENTATION AND ALLOGRAFT ABDOMINAL EXPOSURE.    Patient was given perioperative antibiotics:  Anti-infectives (From admission, onward)    Start     Dose/Rate Route Frequency Ordered Stop   02/15/21 1930  ceFAZolin (ANCEF) IVPB 2g/100 mL premix        2 g 200 mL/hr over 30 Minutes Intravenous Every 8 hours 02/15/21 1300 02/16/21 0321   02/15/21 0600  ceFAZolin (ANCEF) IVPB 2g/100 mL premix        2 g 200 mL/hr over 30 Minutes Intravenous On call to O.R. 02/15/21 0546 02/15/21 1137        Patient was given sequential compression devices,  early ambulation to prevent DVT.  Patient benefited maximally from hospital stay and there were no complications.    Recent vital signs: BP 105/62 (BP Location: Left Arm)   Pulse 62   Temp 98.6 F (37 C) (Oral)   Resp 18   Ht 5\' 2"  (1.575 m)   Wt 52.6 kg   LMP 02/12/2021 (Exact Date)   SpO2 100%   BMI 21.22 kg/m    Discharge Medications:   Allergies as of 02/16/2021       Reactions   Sulfamethoxazole-trimethoprim Hives, Rash   Sulfa Antibiotics Hives, Rash, Swelling        Medication List     TAKE these medications    albuterol 108 (90 Base) MCG/ACT inhaler Commonly known as: VENTOLIN HFA Inhale 1-2 puffs into the lungs every 6 (six) hours as needed for shortness of breath or wheezing.   B-12 PO Take 1 tablet by mouth daily.   methocarbamol 500 MG tablet Commonly known as: ROBAXIN Take 1 tablet (500 mg total) by mouth every 6 (six) hours as needed for muscle spasms.   multivitamin with minerals Tabs tablet Take 1 tablet by mouth daily.   Nexplanon 68 MG Impl implant Generic drug: etonogestrel 68 mg by Subdermal route once.   oxyCODONE-acetaminophen 5-325 MG tablet Commonly known as: PERCOCET/ROXICET Take 1 tablet by mouth every 4 (four) hours as needed for severe pain.        Diagnostic Studies: DG Lumbar Spine 2-3 Views  Result  Date: 02/15/2021 CLINICAL DATA:  L5-S1 ALIF EXAM: LUMBAR SPINE - 2-3 VIEW COMPARISON:  MRI 02/15/2021 FINDINGS: Two low resolution intraoperative spot views of the lumbar spine. Total fluoroscopy time was 2 minutes 17 seconds. The images demonstrate surgical rods, fixating screws and interbody device at L5-S1. IMPRESSION: Intraoperative fluoroscopic assistance provided during lumbar spine surgery. Electronically Signed   By: Jasmine Pang M.D.   On: 02/15/2021 19:10   DG C-Arm 1-60 Min-No Report  Result Date: 02/15/2021 Fluoroscopy was utilized by the requesting physician.  No radiographic interpretation.   CT OUTSIDE FILMS  SPINE  Result Date: 02/15/2021 This examination belongs to an outside facility and is stored here for comparison purposes only.  Contact the originating outside institution for any associated report or interpretation.  MR OUTSIDE FILMS SPINE  Result Date: 02/15/2021 This examination belongs to an outside facility and is stored here for comparison purposes only.  Contact the originating outside institution for any associated report or interpretation.  DG OR LOCAL ABDOMEN  Result Date: 02/15/2021 CLINICAL DATA:  Evaluate for retained instrument. EXAM: OR LOCAL ABDOMEN COMPARISON:  None. FINDINGS: L5-S1 anterior fusion hardware evident. Surgical clip overlies the upper right sacrum. No evidence for retained radiopaque surgical instrumentation. Visualized abdomen demonstrates nonspecific bowel gas. IMPRESSION: 1. No evidence for retained radiopaque surgical instrumentation. 2. Lumbar fusion hardware at L5-S1. Findings called to [place], in the OR, at the time of study interpretation. Electronically Signed   By: Kennith Center M.D.   On: 02/15/2021 10:27    Disposition: Discharge disposition: 01-Home or Self Care       Discharge Instructions     Discharge patient   Complete by: As directed    After PT/OT   Discharge disposition: 01-Home or Self Care   Discharge patient date: 02/16/2021      POD #1 s/p L5-S1 ANT/POST fusion doing well with resolved pre-op pain and expected PO LBP and abdominal soreness from exposure   - up with PT/OT, encourage ambulation - Percocet for pain, Robaxin for muscle spasms -Scripts for pain sent to pharmacy electronically  -D/C instructions sheet printed and in chart -D/C today  -F/U in office 2 weeks   Signed: Eilene Ghazi Oriel Ojo 03/08/2021, 12:07 PM

## 2021-11-07 IMAGING — RF DG LUMBAR SPINE 2-3V
1 series · 2 of 2 positions shown · non-contrast
Comparison: MRI 02/15/2021

CLINICAL DATA: L5-S1 ALIF

EXAM:
LUMBAR SPINE - 2-3 VIEW

[Series 1: run · 2 of 2 slices shown]
[im 1/2]
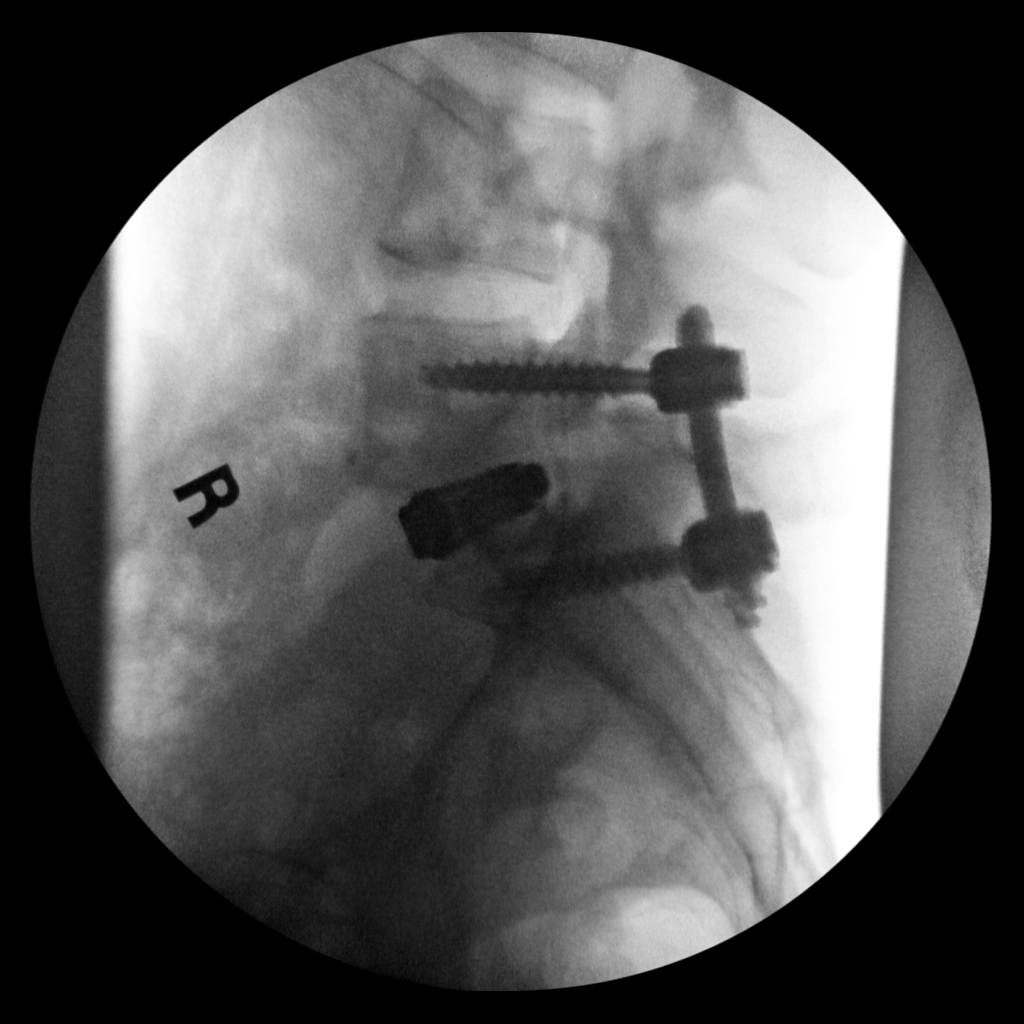
[im 2/2]
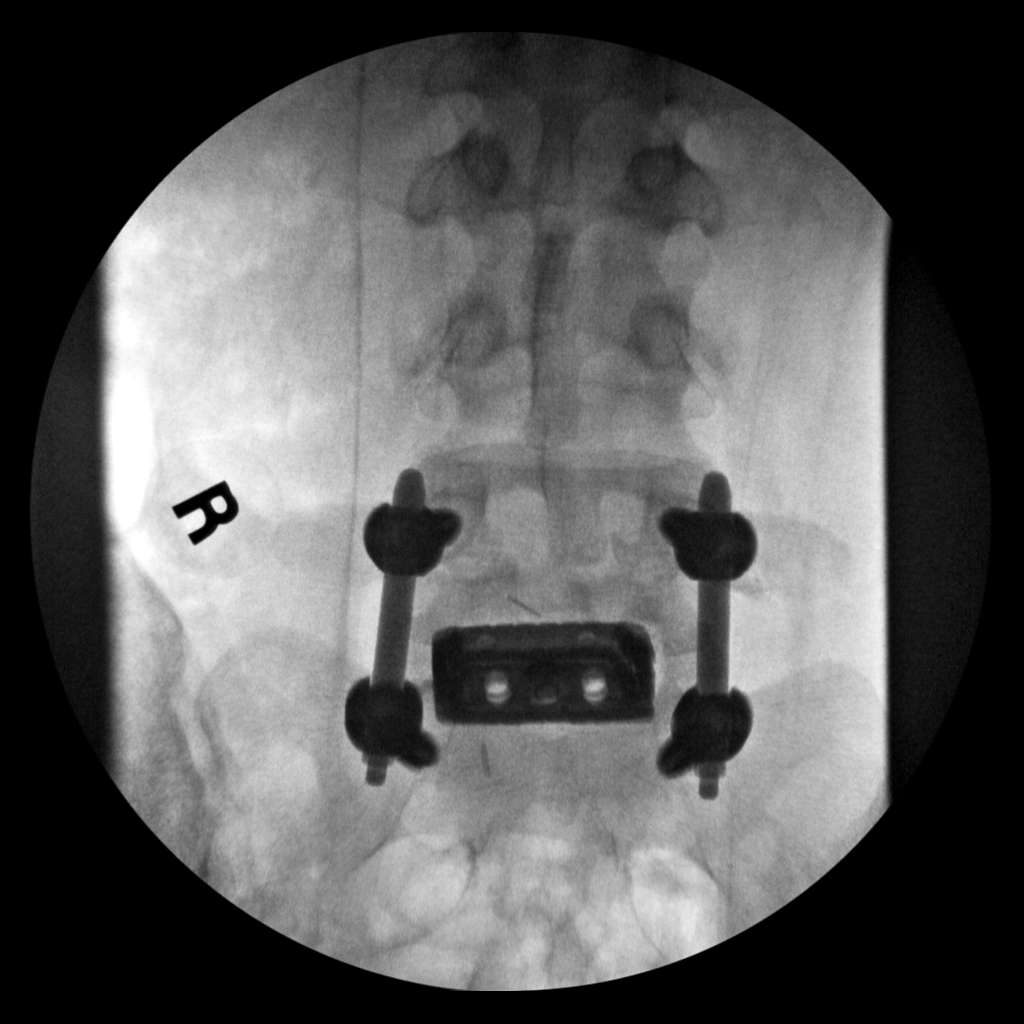

[2 of 2 positions shown; findings below may reference images not displayed]

FINDINGS: Two low resolution intraoperative spot views of the lumbar spine.
Total fluoroscopy time was 2 minutes 17 seconds. The images
demonstrate surgical rods, fixating screws and interbody device at
L5-S1.
IMPRESSION: Intraoperative fluoroscopic assistance provided during lumbar spine
surgery.
